# Patient Record
Sex: Female | Born: 2000
Health system: Southern US, Community
[De-identification: ages and names within clinical notes are randomized; demographics above are authoritative.]

---
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
## 2022-05-01 ENCOUNTER — Encounter: Payer: Self-pay | Admitting: Family Medicine

## 2022-05-01 ENCOUNTER — Ambulatory Visit: Payer: 59 | Admitting: Family Medicine

## 2022-05-01 VITALS — BP 116/64 | HR 75 | Temp 98.6°F | Ht 64.0 in | Wt 255.3 lb

## 2022-05-01 DIAGNOSIS — F322 Major depressive disorder, single episode, severe without psychotic features: Secondary | ICD-10-CM | POA: Diagnosis not present

## 2022-05-01 MED ORDER — FLUOXETINE HCL 20 MG PO CAPS: 20.0000 mg | ORAL_CAPSULE | Freq: Every day | ORAL | 1 refills | Status: DC

## 2022-05-01 NOTE — Progress Notes (Signed)
Established Patient Office Visit  Subjective   Patient ID: Elizabeth [name], female    DOB: 03/02/2001  Age: 22 y.o. MRN: [id number]  Chief Complaint  Patient presents with   Anxiety    Patient complains of anxiety, x1 year   Depression    Patient complains of depression, x1 year     HPI   Elizabeth [name] is seen accompanied by mom with progressive depression symptoms over the past several months.  She is currently attending [employer] and also working about 15 hours/week.  She has had increasing difficulties over the past couple months maintaining responsibilities of work and school.  No prior history of depression but very strong family history of depression in her mom and a couple of aunts.  Elizabeth [name] has had a lot of crying spells and also some anhedonia and low motivation.  Difficulty concentrating at times.  She finds herself interacting less with others.  Occasional early morning insomnia.  No suicidal ideation.  She has had counseling in the past and is scheduled to see her counselor in a couple weeks.  Denies any recent losses or specific stressors otherwise.  No alcohol or drug use.  She frequently feels anxious but no history of high energy or impulsivity  History reviewed. No pertinent past medical history. History reviewed. No pertinent surgical history.  reports that she has never smoked. She has been exposed to tobacco smoke. She has never used smokeless tobacco. No history on file for alcohol use and drug use. family history includes Diabetes in her father and maternal grandmother; Hypertension in her father and mother. No Known Allergies  Review of Systems  Constitutional:  Negative for weight loss.  Cardiovascular:  Negative for chest pain.  Psychiatric/Behavioral:  Positive for depression. Negative for memory loss, substance abuse and suicidal ideas. The patient is nervous/anxious and has insomnia.       Objective:     BP 116/64 (BP Location: Left Arm, Patient  Position: Sitting, Cuff Size: Normal)   Pulse 75   Temp 98.6 F (37 C) (Oral)   Ht 5\' 4"  (1.626 m)   Wt 255 lb 4.8 oz (115.8 kg)   SpO2 98%   BMI 43.82 kg/m  BP Readings from Last 3 Encounters:  05/01/22 116/64  11/05/21 114/60  08/31/21 106/64   Wt Readings from Last 3 Encounters:  05/01/22 255 lb 4.8 oz (115.8 kg) (>99 %, Z= 2.53)*  11/05/21 266 lb 14.4 oz (121.1 kg) (>99 %, Z= 2.58)*  08/31/21 264 lb 12.8 oz (120.1 kg) (>99 %, Z= 2.56)*   * Growth percentiles are based on CDC (Girls, 2-20 Years) data.      Physical Exam Vitals reviewed.  Cardiovascular:     Rate and Rhythm: Normal rate and regular rhythm.     Heart sounds: No murmur heard. Pulmonary:     Effort: Pulmonary effort is normal.     Breath sounds: Normal breath sounds. No wheezing or rales.  Neurological:     Mental Status: She is alert.  Psychiatric:        Mood and Affect: Mood normal.     Comments: PHQ-9 score of 24      No results found for any visits on 05/01/22.    The ASCVD Risk score (Arnett DK, et al., 2019) failed to calculate for the following reasons:   The 2019 ASCVD risk score is only valid for ages 37 to 60    Assessment & Plan:   Major depressive episode severe.  Strong family history of depression.  No suicidal ideation.  -Agree with counseling which she is already set up -Recommend starting Prozac 20 mg once daily.  Review potential side effects.  Set up 3 to 4-week follow-up. -Follow-up immediately for any suicidal ideation or worsening symptoms  30 minutes spent in face-to-face and non-face-to-face time discussing issues of depression and assessing with discussion of  pharmacologic and nonpharmacologic treatments  Carolann Littler, MD

## 2022-05-23 ENCOUNTER — Other Ambulatory Visit: Payer: Self-pay | Admitting: Family Medicine

## 2022-05-27 ENCOUNTER — Encounter: Payer: Self-pay | Admitting: Family Medicine

## 2022-05-27 ENCOUNTER — Ambulatory Visit: Payer: 59 | Admitting: Family Medicine

## 2022-05-27 VITALS — BP 114/60 | HR 80 | Temp 98.5°F | Ht 64.0 in | Wt 253.2 lb

## 2022-05-27 DIAGNOSIS — F322 Major depressive disorder, single episode, severe without psychotic features: Secondary | ICD-10-CM

## 2022-05-27 DIAGNOSIS — F331 Major depressive disorder, recurrent, moderate: Secondary | ICD-10-CM | POA: Insufficient documentation

## 2022-05-27 MED ORDER — FLUOXETINE HCL 20 MG PO CAPS: 20.0000 mg | ORAL_CAPSULE | Freq: Every day | ORAL | 1 refills | Status: DC

## 2022-05-27 NOTE — Progress Notes (Signed)
   Established Patient Office Visit  Subjective   Patient ID: [facility], female    DOB: 14-Jan-2001  Age: 22 y.o. MRN: [id number]  Chief Complaint  Patient presents with   [facility] [place] is seen with major depression for follow-up.  Refer to note last month.  She came in with severe depression symptoms but no suicidal ideation.  Her PHQ-9 score was 24.  She is getting regular counseling and finds it beneficial.  We started Prozac 20 mg daily and she has seen tremendous benefits.  Great reduction in crying spells.  More interactive with others.  Increased motivation.  Increased concentration.  PHQ is down to 14 today.  She is smiling and appears much more animated.  She denies any side effects from medication.  She had tendency to be moody during her periods and has noticed less of that as well.  History reviewed. No pertinent past medical history. History reviewed. No pertinent surgical history.  reports that she has never smoked. She has been exposed to tobacco smoke. She has never used smokeless tobacco. No history on file for alcohol use and drug use. family history includes Diabetes in her father and maternal grandmother; Hypertension in her father and mother. No Known Allergies  Review of Systems  Neurological:  Negative for dizziness.  Psychiatric/Behavioral:  Negative for hallucinations, memory loss, substance abuse and suicidal ideas. The patient is not nervous/anxious.       Objective:     BP 114/60 (BP Location: Left Arm, Patient Position: Sitting, Cuff Size: Large)   Pulse 80   Temp 98.5 F (36.9 C) (Oral)   Ht '5\' 4"'$  (1.626 m)   Wt 253 lb 3.2 oz (114.9 kg)   SpO2 99%   BMI 43.46 kg/m    Physical Exam Vitals reviewed.  Cardiovascular:     Rate and Rhythm: Normal rate and regular rhythm.  Pulmonary:     Effort: Pulmonary effort is normal.     Breath sounds: Normal breath sounds.  Neurological:     Mental  Status: She is alert.  Psychiatric:        Mood and Affect: Mood normal.        Behavior: Behavior normal.        Thought Content: Thought content normal.        Judgment: Judgment normal.      No results found for any visits on 05/27/22.    The ASCVD Risk score (Arnett DK, et al., 2019) failed to calculate for the following reasons:   The 2019 ASCVD risk score is only valid for ages 42 to 58    Assessment & Plan:   Problem List Items Addressed This Visit       Unprioritized   Depression, major, single episode, severe ([place]) - Primary   Relevant Medications   FLUoxetine (PROZAC) 20 MG capsule  Major depressive episode improved.  She is strongly encouraged to continue counseling.  Continue Prozac 20 mg once daily.  Refills given for 90 with 1 refill.  Recommend 40-monthfollow-up.  We discussed minimum of 4 to 6 months of therapy and possibly longer depending on how she is doing  Return in about 4 months (around 09/26/2022).    BCarolann Littler MD

## 2022-05-27 NOTE — Patient Instructions (Signed)
Set up 4 month follow up  Glad you are doing better!  I have sent in refills.

## 2022-08-07 ENCOUNTER — Telehealth: Payer: Self-pay | Admitting: Family Medicine

## 2022-08-07 NOTE — Telephone Encounter (Signed)
Pt is calling and need to know which immunizations she is missing

## 2022-08-08 NOTE — Telephone Encounter (Signed)
I spoke with the patient's mother and she will inform the patient a visit is needed and to contact our office to schedule

## 2022-08-09 ENCOUNTER — Telehealth: Payer: Self-pay | Admitting: Family Medicine

## 2022-08-09 NOTE — Telephone Encounter (Signed)
Requesting a Hep B shot

## 2022-08-09 NOTE — Telephone Encounter (Signed)
Patient has been scheduled on 06/21/22 per request as she has multiple overdue vaccines.

## 2022-08-21 ENCOUNTER — Ambulatory Visit: Payer: 59 | Admitting: Adult Health

## 2022-08-21 ENCOUNTER — Encounter: Payer: Self-pay | Admitting: Adult Health

## 2022-08-21 VITALS — BP 100/60 | HR 70 | Temp 98.1°F | Ht 65.0 in | Wt 252.6 lb

## 2022-08-21 DIAGNOSIS — Z23 Encounter for immunization: Secondary | ICD-10-CM

## 2022-08-21 DIAGNOSIS — Z0184 Encounter for antibody response examination: Secondary | ICD-10-CM | POA: Diagnosis not present

## 2022-08-21 DIAGNOSIS — Z7185 Encounter for immunization safety counseling: Secondary | ICD-10-CM | POA: Diagnosis not present

## 2022-08-21 NOTE — Patient Instructions (Addendum)
It was great seeing you today   You received multiple vaccinations today. Please follow up in one month with Dr. Caryl Never who continue your vaccinations. Bring your old vaccination card with you   I am going to check some blood work on you today to see if you have had the MMR and Varicella vaccination

## 2022-08-21 NOTE — Progress Notes (Signed)
Subjective:    Patient ID: Elizabeth [name], female    DOB: 2001/03/03, 22 y.o.   MRN: [id number]  HPI 22 year old female who  has no past medical history on file.  She presents to the office today for vaccinations. 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 She is unsure of what vaccinations she has had growing up. Her [place] database shows that she is due for most vaccinations. She has not lived out of state.      Review of Systems See HPI   No past medical history on file.  Social History   Socioeconomic History   Marital status: Single    Spouse name: Not on file   Number of children: Not on file   Years of education: Not on file   Highest education level: Not on file  Occupational History   Not on file  Tobacco Use   Smoking status: Never    Passive exposure: Yes   Smokeless tobacco: Never  Substance and Sexual Activity   Alcohol use: Not Currently   Drug use: Yes    Types: Marijuana   Sexual activity: Never  Other Topics Concern   Not on file  Social History Narrative   Is in 4th grade at [employer]   Lives with parents and dog, 1 sister at college   Social Determinants of Health   Financial Resource Strain: Not on file  Food Insecurity: Not on file  Transportation Needs: Not on file  Physical Activity: Not on file  Stress: Not on file  Social Connections: Not on file  Intimate Partner Violence: Not on file    No past surgical history on file.  Family History  Problem Relation Age of Onset   Hypertension Mother    Hypertension Father    Diabetes Father    Diabetes Maternal Grandmother     No Known Allergies  Current Outpatient Medications on File Prior to Visit  Medication Sig Dispense Refill   naproxen (NAPROSYN) 500 MG tablet Take 500 mg by mouth 2 (two) times daily.     FLUoxetine (PROZAC) 20 MG capsule Take 1 capsule (20 mg total) by mouth daily. 90 capsule 1   No current  facility-administered medications on file prior to visit.    BP 100/60 (BP Location: Left Arm, Patient Position: Sitting, Cuff Size: Large)   Pulse 70   Temp 98.1 F (36.7 C) (Oral)   Ht 5\' 5"  (1.651 m)   Wt 252 lb 9.6 oz (114.6 kg)   LMP 07/31/2022 (Exact Date)   SpO2 99%   BMI 42.03 kg/m       Objective:   Physical Exam Vitals and nursing note reviewed.  Constitutional:      Appearance: Normal appearance. She is obese.  Skin:    General: Skin is warm and dry.  Neurological:     General: No focal deficit present.     Mental Status: She is alert and oriented to person, place, and time.  Psychiatric:        Mood and Affect: Mood normal.        Behavior: Behavior normal.        Thought Content: Thought content normal.        Judgment: Judgment normal.       Assessment & Plan:  1. Immunization counseling - Will start with Tdap, Hep B, and Meningitis vaccinations today.  - She was advised to follow up with her PCP  in one month for continued vaccinations and to bring her vaccination card with her   2. Immunity status testing  - Measles/Mumps/Rubella Immunity - Varicella Zoster Antibody, IgG  3. Need for diphtheria-tetanus-pertussis (Tdap) vaccine  - Tdap vaccine greater than or equal to 7yo IM  4. Need for meningitis vaccination  - Meningococcal B, OMV (Bexsero) - Meningococcal MCV4O(Menveo)  5. Need for hepatitis B vaccination  - Heplisav-B (HepB-CPG) Vaccine  Shirline Frees, NP

## 2022-08-22 LAB — MEASLES/MUMPS/RUBELLA IMMUNITY
Mumps IgG: 33.8 AU/mL
Rubella: 1.03 Index
Rubeola IgG: <13.5 AU/mL — ABNORMAL LOW

## 2022-08-22 LAB — VARICELLA ZOSTER ANTIBODY, IGG: Varicella IgG: 241.6 index

## 2022-08-26 ENCOUNTER — Telehealth: Payer: Self-pay | Admitting: Family Medicine

## 2022-08-26 NOTE — Telephone Encounter (Signed)
Pt returned call for results.

## 2022-08-28 NOTE — Telephone Encounter (Signed)
Tried to call pt but no answer. Called pt home number and pt mother advised that she was at work. Will try again

## 2022-08-28 NOTE — Telephone Encounter (Signed)
Pt returned call for results.

## 2022-08-28 NOTE — Telephone Encounter (Signed)
Tried to call pt back no answer .

## 2022-09-02 ENCOUNTER — Ambulatory Visit (INDEPENDENT_AMBULATORY_CARE_PROVIDER_SITE_OTHER): Payer: 59

## 2022-09-02 DIAGNOSIS — Z23 Encounter for immunization: Secondary | ICD-10-CM

## 2022-09-02 NOTE — Progress Notes (Signed)
Imm44

## 2022-09-19 ENCOUNTER — Telehealth: Payer: Self-pay

## 2022-09-19 NOTE — Telephone Encounter (Signed)
LVM for patient to call back [phone], or to call PCP office to schedule CPE apt. AS, CMA  

## 2022-09-20 ENCOUNTER — Telehealth: Payer: Self-pay | Admitting: Family Medicine

## 2022-09-20 NOTE — Telephone Encounter (Signed)
Pt has appt for 09/23/22 for immunization, says last time she was here she was told she was caught up on her shots. Pls advise patient

## 2022-09-23 ENCOUNTER — Encounter: Payer: Self-pay | Admitting: Family Medicine

## 2022-09-23 ENCOUNTER — Ambulatory Visit: Payer: 59 | Admitting: Family Medicine

## 2022-09-23 VITALS — BP 106/60 | HR 81 | Temp 98.1°F | Ht 65.0 in | Wt 254.4 lb

## 2022-09-23 DIAGNOSIS — Z23 Encounter for immunization: Secondary | ICD-10-CM | POA: Diagnosis not present

## 2022-09-23 DIAGNOSIS — F322 Major depressive disorder, single episode, severe without psychotic features: Secondary | ICD-10-CM

## 2022-09-23 NOTE — Patient Instructions (Signed)
Consider second meningitis B vaccine  Consider HPV vaccine series.

## 2022-09-23 NOTE — Telephone Encounter (Signed)
She was seen in office this morning and we addressed immunization questions  Kristian Covey MD [facility]

## 2022-09-23 NOTE — Progress Notes (Signed)
Established Patient Office Visit  Subjective   Patient ID: [facility], female    DOB: 02/28/2001  Age: 22 y.o. MRN: [id number]  No chief complaint on file.   HPI   Khanh is here for immunizations.  She starts [employer] this fall and plans to study nursing.  She was here recently and had to get caught up on several immunizations including Tdap, Menveo, meningitis B, hepatitis B.  She had MMR level and was deficient in rubeola and came back for MMR vaccine.  She is here today for second hepatitis B.  She has decided not to get second meningitis B vaccine  She denies any side effects from prior immunizations.  She is also declining HPV vaccine.  She has history of depression which is very stable currently on Prozac 20 mg daily.  Does not need refills at this point but would like to get refills when she is in school for 90 over in [place].    History reviewed. No pertinent past medical history. History reviewed. No pertinent surgical history.  reports that she has never smoked. She has been exposed to tobacco smoke. She has never used smokeless tobacco. She reports that she does not currently use alcohol. She reports current drug use. Drug: Marijuana. family history includes Diabetes in her father and maternal grandmother; Hypertension in her father and mother. No Known Allergies  Review of Systems  Constitutional:  Negative for chills and fever.  Skin:  Negative for rash.      Objective:     BP 106/60 (BP Location: Left Arm, Patient Position: Sitting, Cuff Size: Normal)   Pulse 81   Temp 98.1 F (36.7 C) (Oral)   Ht 5\' 5"  (1.651 m)   Wt 254 lb 6.4 oz (115.4 kg)   SpO2 99%   BMI 42.33 kg/m  BP Readings from Last 3 Encounters:  09/23/22 106/60  08/21/22 100/60  05/27/22 114/60   Wt Readings from Last 3 Encounters:  09/23/22 254 lb 6.4 oz (115.4 kg) (>99 %, Z= 2.54)*  08/21/22 252 lb 9.6 oz (114.6 kg) (>99 %, Z= 2.52)*  05/27/22 253 lb 3.2  oz (114.9 kg) (>99 %, Z= 2.51)*   * Growth percentiles are based on CDC (Girls, 2-20 Years) data.      Physical Exam Vitals reviewed.  Constitutional:      General: She is not in acute distress.    Appearance: Normal appearance.  Cardiovascular:     Rate and Rhythm: Normal rate and regular rhythm.  Pulmonary:     Effort: Pulmonary effort is normal.     Breath sounds: Normal breath sounds. No wheezing or rales.  Neurological:     Mental Status: She is alert.      No results found for any visits on 09/23/22.    The ASCVD Risk score (Arnett DK, et al., 2019) failed to calculate for the following reasons:   The 2019 ASCVD risk score is only valid for ages 50 to 80    Assessment & Plan:   #1 vaccination status and vaccination counseling.  Patient here for second hepatitis B vaccine.  We also mentioned second meningitis B vaccine but she declines.  We discussed other vaccines such as HPV but she declines.  Would also advise yearly flu vaccine  #2 history of depression currently stable on Prozac 20 mg daily.  Patient currently not needing refills but will need refills in about a month.  Continue current dosage which seems to be working  well for her   Evelena Peat, MD

## 2022-09-27 ENCOUNTER — Ambulatory Visit: Payer: 59 | Admitting: Family Medicine

## 2022-09-30 ENCOUNTER — Ambulatory Visit: Payer: 59 | Admitting: Family Medicine

## 2022-11-01 ENCOUNTER — Telehealth: Payer: Self-pay | Admitting: Family Medicine

## 2022-11-01 NOTE — Telephone Encounter (Signed)
Prescription Request  11/01/2022  LOV: 09/23/2022  What is the name of the medication or equipment? FLUoxetine (PROZAC) 20 MG capsule   Pt is requesting a 90 day supply  Have you contacted your pharmacy to request a refill? No   Which pharmacy would you like this sent to?  [id number] [place], [place] - [address] [place] [address] Phone: [phone] Fax: [phone]    Patient notified that their request is being sent to the clinical staff for review and that they should receive a response within 2 business days.   Please advise at Mobile [phone] (mobile)

## 2022-11-04 MED ORDER — FLUOXETINE HCL 20 MG PO CAPS: 20.0000 mg | ORAL_CAPSULE | Freq: Every day | ORAL | 1 refills | Status: DC

## 2022-11-04 NOTE — Telephone Encounter (Signed)
Rx done. 

## 2023-02-04 ENCOUNTER — Telehealth: Payer: Self-pay | Admitting: Family Medicine

## 2023-02-04 NOTE — Telephone Encounter (Signed)
Meningitis B vaccine product name was entered into [facility] and copy has been printed and faxed to [facility] providing clarification.

## 2023-02-04 NOTE — Telephone Encounter (Signed)
Asking that you fax the details of immunization confirming the previous meningococcal B  vaccination product was Bexsero. Asks that you fax that confirmation to [phone] or if you could load that into the system

## 2023-03-03 ENCOUNTER — Telehealth: Payer: Self-pay | Admitting: Family Medicine

## 2023-03-03 NOTE — Telephone Encounter (Signed)
Patient informed to contact pharmacy in regards to prescription. 90 capsules with 1 refill was sent on 11/04/2022

## 2023-03-03 NOTE — Telephone Encounter (Signed)
Pt is calling and per pt pharm gave her only #30 instead of #90 FLUoxetine (PROZAC) 20 MG capsule [id number] [place], [place] - [address] Phone: [phone]  Fax: [phone]

## 2023-03-14 ENCOUNTER — Other Ambulatory Visit (HOSPITAL_COMMUNITY)
Admission: RE | Admit: 2023-03-14 | Discharge: 2023-03-14 | Disposition: A | Discharge status: Home / Self Care | Payer: 59 | Source: Ambulatory Visit | Attending: Family Medicine | Admitting: Family Medicine

## 2023-03-14 ENCOUNTER — Encounter: Payer: Self-pay | Admitting: Family Medicine

## 2023-03-14 ENCOUNTER — Ambulatory Visit: Payer: 59 | Admitting: Family Medicine

## 2023-03-14 VITALS — BP 124/60 | HR 95 | Temp 98.2°F | Wt 268.7 lb

## 2023-03-14 DIAGNOSIS — N898 Other specified noninflammatory disorders of vagina: Secondary | ICD-10-CM | POA: Diagnosis present

## 2023-03-14 DIAGNOSIS — B3731 Acute candidiasis of vulva and vagina: Secondary | ICD-10-CM | POA: Diagnosis not present

## 2023-03-14 MED ORDER — FLUCONAZOLE 150 MG PO TABS: ORAL_TABLET | ORAL | 0 refills | Status: AC

## 2023-03-14 NOTE — Progress Notes (Unsigned)
   Established Patient Office Visit  Subjective   Patient ID: [facility], female    DOB: 17-Jul-2000  Age: 22 y.o. MRN: [id number]  Chief Complaint  Patient presents with   Vaginal Itching    Patient complains of vaginal itching, x3 weeks     HPI  {History (Optional):23778} Elizabeth [name] seen with 3-week history of vaginal itch.  She states that she engages in some oral sex previously but has never had intercourse.  Denies any risk for STD spread by intercourse.  She denies any rashes.  No significant discharge.  No fevers or chills.  No dysuria.  She has fairly extreme pruritus at times.  Has tried Aquaphor without any relief.  Denies any prior history of STDs.  Denies any recent antibiotic use or prednisone.  History reviewed. No pertinent past medical history. History reviewed. No pertinent surgical history.  reports that she has never smoked. She has been exposed to tobacco smoke. She has never used smokeless tobacco. She reports that she does not currently use alcohol. She reports current drug use. Drug: Marijuana. family history includes Diabetes in her father and maternal grandmother; Hypertension in her father and mother. No Known Allergies\ Review of Systems  Genitourinary:  Negative for dysuria.      Objective:     BP 124/60 (BP Location: Left Arm, Patient Position: Sitting, Cuff Size: Large)   Pulse 95   Temp 98.2 F (36.8 C) (Oral)   Wt 268 lb 11.2 oz (121.9 kg)   SpO2 98%   BMI 44.71 kg/m  {Vitals History (Optional):23777}  Physical Exam Vitals reviewed.  Constitutional:      General: She is not in acute distress.    Appearance: She is not ill-appearing.  Cardiovascular:     Rate and Rhythm: Normal rate and regular rhythm.  Genitourinary:    Comments: Vaginal exam with nurse chaperone present.  Normal external genitalia.  She does have some thick whitish discharge intravaginally.  Mild erythema.  No other obvious abnormalities. Neurological:      Mental Status: She is alert.      No results found for any visits on 03/14/23.  {Labs (Optional):23779}  The ASCVD Risk score (Arnett DK, et al., 2019) failed to calculate for the following reasons:   The 2019 ASCVD risk score is only valid for ages 69 to 23    Assessment & Plan:   Presents with 3-week history of vaginal pruritus.  Suspect candidiasis -Wet prep sent -Fluconazole 150 mg x 1 dose -Also discussed possible use as needed in the future of over-the-counter Gyne-Lotrimin or Monistat -We did discuss STD screening.  She adamantly denies ever having intercourse.    No follow-ups on file.    Evelena Peat, MD

## 2023-03-14 NOTE — Patient Instructions (Signed)
Could try OTC Monistat or Gyne-lotrimin     Go ahead and take the Fluconazole tablet  Let me know if not clearing in 3-4 days.

## 2023-03-18 LAB — CERVICOVAGINAL ANCILLARY ONLY
Bacterial Vaginitis (gardnerella): NEGATIVE
Candida Glabrata: NEGATIVE
Candida Vaginitis: POSITIVE — AB
Comment: NEGATIVE
Comment: NEGATIVE
Comment: NEGATIVE

## 2023-04-18 ENCOUNTER — Ambulatory Visit: Payer: 59 | Admitting: Family Medicine

## 2023-04-18 ENCOUNTER — Encounter: Payer: Self-pay | Admitting: Family Medicine

## 2023-04-18 VITALS — BP 124/80 | HR 88 | Temp 97.6°F | Wt 272.4 lb

## 2023-04-18 DIAGNOSIS — R7303 Prediabetes: Secondary | ICD-10-CM | POA: Diagnosis not present

## 2023-04-18 DIAGNOSIS — B3731 Acute candidiasis of vulva and vagina: Secondary | ICD-10-CM

## 2023-04-18 LAB — POCT GLYCOSYLATED HEMOGLOBIN (HGB A1C): Hemoglobin A1C: 5.6 % (ref 4.0–5.6)

## 2023-04-18 MED ORDER — FLUCONAZOLE 100 MG PO TABS: ORAL_TABLET | ORAL | 1 refills | Status: AC

## 2023-04-18 NOTE — Patient Instructions (Signed)
Can use over the counter Gyne-Lotrimin or Monistat  A1C is 5.6%.

## 2023-04-18 NOTE — Progress Notes (Unsigned)
   Established Patient Office Visit  Subjective   Patient ID: [facility], female    DOB: 02-11-2001  Age: 23 y.o. MRN: [id number]  Chief Complaint  Patient presents with   Vaginal Discharge    HPI  {History [facility] seen with recurrent pruritic whitish vaginal discharge.  Recent wet prep revealed Candida.  No recent antibiotics.  No recent prednisone use.  She did improve with fluconazole 150 mg x 1 dose but never took second dose.  She does have prior history of prediabetes range blood sugars and we had suggested repeat A1c if she had any recurrent symptoms.  She is currently not sexually active in terms of any intercourse.  Dysuria.  No fever.  History reviewed. No pertinent past medical history. History reviewed. No pertinent surgical history.  reports that she has never smoked. She has been exposed to tobacco smoke. She has never used smokeless tobacco. She reports that she does not currently use alcohol. She reports current drug use. Drug: Marijuana. family history includes Diabetes in her father and maternal grandmother; Hypertension in her father and mother. No Known Allergies  Review of Systems  Constitutional:  Negative for fever.  Genitourinary:  Negative for dysuria and hematuria.      Objective:     BP 124/80 (BP Location: Left Arm, Patient Position: Sitting, Cuff Size: Large)   Pulse 88   Temp 97.6 F (36.4 C) (Oral)   Wt 272 lb 6.4 oz (123.6 kg)   SpO2 97%   BMI 45.33 kg/m  BP Readings from Last 3 Encounters:  04/18/23 124/80  03/14/23 124/60  09/23/22 106/60   Wt Readings from Last 3 Encounters:  04/18/23 272 lb 6.4 oz (123.6 kg)  03/14/23 268 lb 11.2 oz (121.9 kg)  09/23/22 254 lb 6.4 oz (115.4 kg) (>99%, Z= 2.54)*   * Growth percentiles are based on CDC (Girls, 2-20 Years) data.      Physical Exam Vitals reviewed.  Constitutional:      General: She is not in acute distress.    Appearance: She is not ill-appearing.   Cardiovascular:     Rate and Rhythm: Normal rate and regular rhythm.  Neurological:     Mental Status: She is alert.      Results for orders placed or performed in visit on 04/18/23  POC HgB A1c  Result Value Ref Range   Hemoglobin A1C 5.6 4.0 - 5.6 %   HbA1c POC (<> result, manual entry)     HbA1c, POC (prediabetic range)     HbA1c, POC (controlled diabetic range)      {Labs (Optional):23779}  The ASCVD Risk score (Arnett DK, et al., 2019) failed to calculate for the following reasons:   The 2019 ASCVD risk score is only valid for ages 69 to 15    Assessment & Plan:   #1 recurrent yeast vaginitis.  Recent wet prep positive for Candida.  No recent prednisone or antibiotic use.  Does have prior history of prediabetes.  Recheck A1c today 5.6% which is stable.  -Fluconazole 100 mg once daily for 3 days consecutively.  If symptoms recur again consider low-dose of 100 mg to 200 mg once weekly for several weeks -Avoid bubble baths No follow-ups on file.    Evelena Peat, MD

## 2023-05-24 ENCOUNTER — Other Ambulatory Visit: Payer: Self-pay | Admitting: Family Medicine

## 2023-09-01 ENCOUNTER — Encounter: Payer: Self-pay | Admitting: Family Medicine

## 2023-09-01 ENCOUNTER — Ambulatory Visit: Payer: Self-pay

## 2023-09-01 ENCOUNTER — Ambulatory Visit: Admitting: Family Medicine

## 2023-09-01 ENCOUNTER — Ambulatory Visit

## 2023-09-01 VITALS — BP 122/80 | HR 82 | Resp 16 | Ht 65.0 in | Wt 276.1 lb

## 2023-09-01 DIAGNOSIS — F99 Mental disorder, not otherwise specified: Secondary | ICD-10-CM | POA: Diagnosis not present

## 2023-09-01 DIAGNOSIS — F331 Major depressive disorder, recurrent, moderate: Secondary | ICD-10-CM

## 2023-09-01 DIAGNOSIS — F419 Anxiety disorder, unspecified: Secondary | ICD-10-CM | POA: Diagnosis not present

## 2023-09-01 DIAGNOSIS — F5105 Insomnia due to other mental disorder: Secondary | ICD-10-CM

## 2023-09-01 MED ORDER — FLUOXETINE HCL 40 MG PO CAPS
40.0000 mg | ORAL_CAPSULE | Freq: Every day | ORAL | 0 refills | Status: DC
Start: 2023-09-01 — End: 2023-12-01

## 2023-09-01 NOTE — Telephone Encounter (Signed)
 Noted

## 2023-09-01 NOTE — Progress Notes (Signed)
 ACUTE VISIT Chief Complaint  Patient presents with   Depression   HPI: Elizabeth [name] is a 23 y.o. female with a PMHx significant for HTN, insulin resistance, prediabetes, anxiety, and depression, among some, who is here today for depression and anxiety.  Currently on Fluoxetine  20 mg daily. She has been this medication for 1-2 years, and it is the first one she has tried. She would like to try a higher dose.  She says her symptoms have been worse for the last 3-4 months, and believes this may be related to starting college. She also works two jobs.  Sleep: 4-5 hours per night. She says she has difficulty sleeping due to anxiety. States that when is is quite her mind does not stop. No known hx of bipolar disorder.  Exercise: she admits she has not been exercising.  FMHx of anxiety in her mother.  She has not seen a psychotherapist before.  Mentions she had thoughts of harming her father last night after an argument with both parents. She says she thought of "slitting his throat." Denies suicidal thoughts. Denies visual or auditory hallucinations.      09/01/2023    1:17 PM 04/18/2023    4:02 PM 08/21/2022    8:27 AM 05/27/2022    9:09 AM 05/01/2022    9:01 AM  Depression screen PHQ 2/9  Decreased Interest 2 0 0 1 2  Down, Depressed, Hopeless 2 0 0 2 2  PHQ - 2 Score 4 0 0 3 4  Altered sleeping 2 0 0 2 3  Tired, decreased energy 2 0 0 2 3  Change in appetite 2 0 2 2 3   Feeling bad or failure about yourself  2 0 2 2 3   Trouble concentrating 0 0 0 2 3  Moving slowly or fidgety/restless 0 0 0 0 2  Suicidal thoughts 2 0 0 1 3  PHQ-9 Score 14 0 4 14 24   Difficult doing work/chores Very difficult Not difficult at all  Not difficult at all Not difficult at all      09/01/2023    1:16 PM 08/21/2022    8:28 AM 05/27/2022    9:09 AM 05/01/2022    9:01 AM  GAD 7 : Generalized Anxiety Score  Nervous, Anxious, on Edge 1 2 1 3   Control/stop worrying 1 2 1 3   Worry too much -  different things 1 2 1 3   Trouble relaxing 2 2 1 3   Restless 0 0 0 2  Easily annoyed or irritable 3 2 1 3   Afraid - awful might happen 0 0 2 3  Total GAD 7 Score 8 10 7 20   Anxiety Difficulty Very difficult  Not difficult at all Not difficult at all   Review of Systems  Constitutional:  Positive for fatigue. Negative for activity change, appetite change and unexpected weight change.  Respiratory:  Negative for cough and shortness of breath.   Cardiovascular:  Negative for chest pain and palpitations.  Gastrointestinal:  Negative for abdominal pain and nausea.  Endocrine: Negative for cold intolerance and heat intolerance.  Neurological:  Negative for syncope, weakness and headaches.  Psychiatric/Behavioral:  Positive for sleep disturbance. Negative for confusion and hallucinations. The patient is nervous/anxious.   See other pertinent positives and negatives in HPI.  Current Outpatient Medications on File Prior to Visit  Medication Sig Dispense Refill   fluconazole  (DIFLUCAN ) 100 MG tablet Take one tablet by mouth daily for 3 days as needed for candida flare.  3 tablet 1   fluconazole  (DIFLUCAN ) 150 MG tablet Take one tablet times one dose as needed for yeast infection. 2 tablet 0   naproxen (NAPROSYN) 500 MG tablet Take 500 mg by mouth 2 (two) times daily.     No current facility-administered medications on file prior to visit.   History reviewed. No pertinent past medical history. No Known Allergies  Social History   Socioeconomic History   Marital status: Single    Spouse name: Not on file   Number of children: Not on file   Years of education: Not on file   Highest education level: Not on file  Occupational History   Not on file  Tobacco Use   Smoking status: Never    Passive exposure: Yes   Smokeless tobacco: Never  Substance and Sexual Activity   Alcohol use: Not Currently   Drug use: Yes    Types: Marijuana   Sexual activity: Never  Other Topics Concern   Not on  file  Social History Narrative   Is in 4th grade at [employer]   Lives with parents and dog, 1 sister at college   Social Drivers of Health   Financial Resource Strain: Not on file  Food Insecurity: Not on file  Transportation Needs: Not on file  Physical Activity: Not on file  Stress: Not on file  Social Connections: Not on file   Vitals:   09/01/23 1250  BP: 122/80  Pulse: 82  Resp: 16  SpO2: 98%   Body mass index is 45.95 kg/m.  Physical Exam Vitals and nursing note reviewed.  Constitutional:      General: She is not in acute distress.    Appearance: She is well-developed.  HENT:     Head: Normocephalic and atraumatic.     Mouth/Throat:     Mouth: Mucous membranes are moist.     Pharynx: Oropharynx is clear.  Eyes:     Conjunctiva/sclera: Conjunctivae normal.  Cardiovascular:     Rate and Rhythm: Normal rate and regular rhythm.     Heart sounds: No murmur heard. Pulmonary:     Effort: Pulmonary effort is normal. No respiratory distress.     Breath sounds: Normal breath sounds.  Lymphadenopathy:     Cervical: No cervical adenopathy.  Skin:    General: Skin is warm.     Findings: No erythema or rash.  Neurological:     General: No focal deficit present.     Mental Status: She is alert and oriented to person, place, and time.     Cranial Nerves: No cranial nerve deficit.     Gait: Gait normal.  Psychiatric:        Mood and Affect: Mood is not depressed. Affect is not angry.        Thought Content: Thought content does not include homicidal or suicidal ideation. Thought content does not include homicidal or suicidal plan.   ASSESSMENT AND PLAN:  Ms. Garrow was seen today for depression.   Insomnia due to other mental disorder Aggravated by anxiety. Other psychiatric disorders to be considered. OTC Melatonin 5-15 mg 2 hours before bedtime and good sleep hygiene recommended.  Depression, major, recurrent, moderate (HCC) Problem is getting  worse. We discussed options, including adding a new agent. She prefers to increase the dose of Fluoxetine . Denies suicidal or homicidal thoughts at this time. She is not interested in CBT. F/U in 6 weeks with PCP. Instructed about warning signs.   -  FLUoxetine  HCl; Take 1 capsule (40 mg total) by mouth daily.  Dispense: 90 capsule; Refill: 0  Anxiety disorder, unspecified type She is not interested in CPT or adding Buspar. Fluoxetine  dose increased from 20 mg to 40 mg daily. F/U in 6 weeks, before if needed.  -     FLUoxetine  HCl; Take 1 capsule (40 mg total) by mouth daily.  Dispense: 90 capsule; Refill: 0  Return in about 6 weeks (around 10/13/2023) for Anxirty and depression with PCP.  I, Fritz Jewel Wierda, acting as a scribe for Brittony Billick [place], MD., have documented all relevant documentation on the behalf of Mickenzie Stolar [place], MD, as directed by  Cayleb Jarnigan [place], MD while in the presence of Caoilainn Sacks [place], MD.   I, Marchell Froman [place], MD, have reviewed all documentation for this visit. The documentation on 09/01/23 for the exam, diagnosis, procedures, and orders are all accurate and complete.  Jazari Ober G. [place], MD  [facility]. [place] office.

## 2023-09-01 NOTE — Patient Instructions (Addendum)
 A few things to remember from today's visit:  Depression, major, recurrent, moderate (HCC) - Plan: FLUoxetine  (PROZAC ) 40 MG capsule  Anxiety disorder, unspecified type - Plan: FLUoxetine  (PROZAC ) 40 MG capsule  Insomnia due to other mental disorder Increase Fluoxetine  from 20 mg to 40 mg. Arrange a 6 weeks follow up. Over the counter melatonin 5-15 mg 2 hours before bedtime with empty stomach may also help with sleep. Try to walk 10-15 min daily.  If you need refills for medications you take chronically, please call your pharmacy. Do not use My Chart to request refills or for acute issues that need immediate attention. If you send a my chart message, it may take a few days to be addressed, specially if I am not in the office.  Please be sure medication list is accurate. If a new problem present, please set up appointment sooner than planned today.

## 2023-09-01 NOTE — Telephone Encounter (Signed)
 FYI Only or Action Required?: FYI only for provider  Patient was last seen in primary care on 04/18/2023 by Marquetta Sit, MD. Called Nurse Triage reporting Depression. Symptoms began several years ago. Interventions attempted: Nothing. Symptoms are: gradually worsening.  Triage Disposition: Call PCP Within 24 Hours  Patient/caregiver understands and will follow disposition?: Yes       Copied from CRM [id number]. Topic: Clinical - Red Word Triage >> Sep 01, 2023 10:19 AM [employer] wrote: Red Word that prompted transfer to Nurse Triage: Patient called to request increase of dosage for FLUoxetine  (PROZAC ) 20 MG capsule because she has been having increased anger and suicidal thoughts. Reason for Disposition  [1] Patient reveals suicidal thoughts BUT [2] no suicidal plans or threats AND [3] caller feels patient is safe  Answer Assessment - Initial Assessment Questions 1. CONCERN: "What happened that made you call today?" "What is your main question or concern about suicide (or depression)?"     Her mom wants her to be on her medication.  2. SUICIDAL ATTEMPT: "Have you tried to hurt yourself recently?" If yes, "When was that?"     No- has not attempted 3. RISK OF HARM - SUICIDAL IDEATION: "In the past week, did you have thoughts of hurting or killing yourself?" (e.g., yes, no, no but preoccupation with thoughts about death) - WISH TO BE DEAD: "Have you ever wished you were dead or wished you could go to sleep and not wake up?" "Have you felt that you or your family would be better off if you were dead?" - INTENT: " Have you had any thoughts of hurting or killing yourself? (e.g., yes, no, N/A) If yes: "Are you having these thoughts about killing yourself right NOW?" - PLAN: If yes: "Have you thought about how you might do this? Do you have a specific plan in mind?" (e.g., gun, knife, overdose, hanging, no plan) - ACCESS: If yes to PLAN, "Do you have access to knife?" (pills, firearms, knife,  etc)     no 4. RISK OF HARM - SUICIDAL BEHAVIOR: "Have you ever done anything, started to do anything or prepared to do anything to end your life?" (collected pills, access to a gun, wrote a note, cut yourself, etc)     Thought of places to drive her car off months ago.  5. FIREARMS: "Do you have any guns in your home?"     no 6. ONSET: "When did the suicidal behavior (or depression) begin?"    3-4  years 7. EVENTS AND STRESSORS: "Has there been any new stress or recent changes in your life?" (e.g., recent loss of loved one, negative event, etc)     Arguments with her parents, work 8. FUNCTIONAL IMPAIRMENT: "How have things been going for you overall?  Have you had any more difficulties than usual doing your normal daily activities?" (e.g., better, same, worse; self-care, school, work, interactions)     Yes,  9. RECURRENT SYMPTOMS: "Have you ever done this before?" If so, ask: "When was the last time?" and "What happened that time?"       no   10. THERAPIST: "Do you (or your teen) have a counselor or therapist? Name?"       no  Note to Tri[place]:  It's better to speak to the child or teen directly for these calls.   Pt states that last night she had an argument with her parents and she thought about slitting her dads throat and she has a knife  in her room.  Protocols used: Suicide Concerns-P-AH

## 2023-10-13 ENCOUNTER — Ambulatory Visit: Admitting: Family Medicine

## 2023-10-17 ENCOUNTER — Ambulatory Visit: Admitting: Family Medicine

## 2023-10-17 ENCOUNTER — Encounter: Payer: Self-pay | Admitting: Family Medicine

## 2023-10-17 ENCOUNTER — Other Ambulatory Visit (HOSPITAL_COMMUNITY): Payer: Self-pay

## 2023-10-17 ENCOUNTER — Telehealth: Payer: Self-pay

## 2023-10-17 VITALS — BP 110/74 | HR 82 | Temp 98.2°F | Ht 65.0 in | Wt 270.9 lb

## 2023-10-17 DIAGNOSIS — F331 Major depressive disorder, recurrent, moderate: Secondary | ICD-10-CM | POA: Diagnosis not present

## 2023-10-17 DIAGNOSIS — F419 Anxiety disorder, unspecified: Secondary | ICD-10-CM

## 2023-10-17 MED ORDER — TIRZEPATIDE-WEIGHT MANAGEMENT 2.5 MG/0.5ML ~~LOC~~ SOLN: 2.5000 mg | SUBCUTANEOUS | 0 refills | Status: AC

## 2023-10-17 NOTE — Telephone Encounter (Signed)
 Pharmacy Patient Advocate Encounter   Received notification from CoverMyMeds that prior authorization for Zepbound 2.5 is required/requested.   Insurance verification completed.   The patient is insured through [facility] .   Per test claim: PA required; PA submitted to above mentioned insurance via CoverMyMeds Key/confirmation #/EOC [id number] Status is pending

## 2023-10-17 NOTE — Telephone Encounter (Signed)
   Pharmacy Patient Advocate Encounter  Received notification from [employer] that Prior Authorization for Zepbound 2.5 has been APPROVED from 10/17/23 to 04/19/23. Ran test claim, Copay is $24.99. This test claim was processed through [facility]- copay amounts may vary at other pharmacies due to pharmacy/plan contracts, or as the patient moves through the different stages of their insurance plan.   PA #/Case ID/Reference #: [id number]

## 2023-10-17 NOTE — Progress Notes (Signed)
 Established Patient Office Visit  Subjective   Patient ID: [facility], female    DOB: [birth year]  Age: 23 y.o. MRN: [id number]  Chief Complaint  Patient presents with   Medical Management of Chronic Issues    Pt is here for 6wks f/u. Pt reports no concerns.     HPI   Elizabeth [name] is seen for medical follow-up.  Recent worsening depression.  She was seen in my absence July 6 and Prozac  was increased from 20 to 40 mg.  She feels like this dose has helped significantly.  Feels much brighter in her mood.  Sleeping somewhat better as well.  Overall, she is very pleased.  She has more interest in doing things.  She is excited about starting back college August 18.  She attends [employer]  She does have questions regarding weight loss medications.  She has tried different eating plans in the past and tried some type of tablet in the past without much success.  Specifically is interested in GLP-1 medications.  She has had prediabetes range blood sugars in the past.  Morbid obesity with BMI over 45.  No history of pancreatitis.  No family history of thyroid  cancer.  No past medical history on file. No past surgical history on file.  reports that she has never smoked. She has been exposed to tobacco smoke. She has never used smokeless tobacco. She reports that she does not currently use alcohol. She reports current drug use. Drug: Marijuana. family history includes Diabetes in her father and maternal grandmother; Hypertension in her father and mother. No Known Allergies  Review of Systems  Constitutional:  Negative for malaise/fatigue.  Eyes:  Negative for blurred vision.  Respiratory:  Negative for shortness of breath.   Cardiovascular:  Negative for chest pain.  Gastrointestinal:  Negative for abdominal pain, nausea and vomiting.  Neurological:  Negative for dizziness, weakness and headaches.  Psychiatric/Behavioral:  Negative for depression and suicidal ideas.        Objective:     BP 110/74 (BP Location: Left Arm, Patient Position: Sitting, Cuff Size: Large)   Pulse 82   Temp 98.2 F (36.8 C) (Oral)   Ht 5' 5 (1.651 m)   Wt 270 lb 14.4 oz (122.9 kg)   LMP 10/10/2023 (Approximate)   SpO2 98%   BMI 45.08 kg/m  BP Readings from Last 3 Encounters:  10/17/23 110/74  09/01/23 122/80  04/18/23 124/80   Wt Readings from Last 3 Encounters:  10/17/23 270 lb 14.4 oz (122.9 kg)  09/01/23 276 lb 2 oz (125.2 kg)  04/18/23 272 lb 6.4 oz (123.6 kg)      Physical Exam Vitals reviewed.  Constitutional:      General: She is not in acute distress.    Appearance: She is not ill-appearing.  Cardiovascular:     Rate and Rhythm: Normal rate and regular rhythm.  Pulmonary:     Effort: Pulmonary effort is normal.     Breath sounds: Normal breath sounds. No wheezing or rales.  Neurological:     Mental Status: She is alert.  Psychiatric:        Mood and Affect: Mood normal.      No results found for any visits on 10/17/23.  Last CBC Lab Results  Component Value Date   WBC 9.3 01/16/2021   HGB 12.5 01/16/2021   HCT 39.6 01/16/2021   MCV 79.9 01/16/2021   MCH 26.1 12/10/2019   RDW 13.1 01/16/2021   PLT 416.0  01/16/2021   Last metabolic panel Lab Results  Component Value Date   GLUCOSE 82 01/16/2021   NA 136 01/16/2021   K 3.7 01/16/2021   CL 102 01/16/2021   CO2 25 01/16/2021   BUN 12 01/16/2021   CREATININE 0.62 01/16/2021   GFR 130.48 01/16/2021   CALCIUM 9.8 01/16/2021   PROT 7.5 01/16/2021   ALBUMIN 4.2 01/16/2021   BILITOT 0.4 01/16/2021   ALKPHOS 61 01/16/2021   AST 17 01/16/2021   ALT 13 01/16/2021   Last lipids Lab Results  Component Value Date   CHOL 158 01/16/2021   HDL 42.80 01/16/2021   LDLCALC 103 (H) 01/16/2021   TRIG 58.0 01/16/2021   CHOLHDL 4 01/16/2021   Last hemoglobin A1c Lab Results  Component Value Date   HGBA1C 5.6 04/18/2023   Last thyroid  functions Lab Results  Component Value Date   TSH  1.47 01/16/2021      The ASCVD Risk score (Arnett DK, et al., 2019) failed to calculate for the following reasons:   The 2019 ASCVD risk score is only valid for ages 55 to 53    Assessment & Plan:   #1  Major depression improved with increased dose of Prozac  to 40 mg daily.  She is very pleased with results thus far.  Continue current dose of Prozac .  Be in touch for any relapse or worsening depression symptoms  #2 morbid obesity with BMI over 45.  Patient has not done well in the past with various plans.  She is aware of importance of maintaining healthy diet and regular exercise in managing obesity.  She specifically would like to consider GLP-1 medication.  No contraindications.  We explained her challenge would be getting insurance coverage.  Will try Zepbound 2.5 mg subcutaneous once weekly.  Reviewed potential side effects.  Set up 59-month follow-up   Return in about 3 months (around 01/17/2024).    Wolm Scarlet, MD

## 2023-11-29 ENCOUNTER — Other Ambulatory Visit: Payer: Self-pay | Admitting: Family Medicine

## 2023-11-29 DIAGNOSIS — F331 Major depressive disorder, recurrent, moderate: Secondary | ICD-10-CM

## 2023-11-29 DIAGNOSIS — F419 Anxiety disorder, unspecified: Secondary | ICD-10-CM

## 2024-03-24 ENCOUNTER — Other Ambulatory Visit (HOSPITAL_COMMUNITY): Payer: Self-pay
